# Patient Record
Sex: Female | Born: 1990 | Race: White | Hispanic: No | Marital: Single | Smoking: Never smoker
Health system: Southern US, Community
[De-identification: ages and names within clinical notes are randomized; demographics above are authoritative.]

---

## 2013-10-28 ENCOUNTER — Ambulatory Visit (INDEPENDENT_AMBULATORY_CARE_PROVIDER_SITE_OTHER): Payer: 59 | Admitting: Emergency Medicine

## 2013-10-28 ENCOUNTER — Other Ambulatory Visit: Payer: Self-pay | Admitting: Emergency Medicine

## 2013-10-28 ENCOUNTER — Ambulatory Visit (HOSPITAL_COMMUNITY)
Admission: RE | Admit: 2013-10-28 | Discharge: 2013-10-28 | Disposition: A | Discharge status: Home / Self Care | Payer: 59 | Source: Ambulatory Visit | Attending: Emergency Medicine | Admitting: Emergency Medicine

## 2013-10-28 VITALS — BP 124/78 | HR 98 | Temp 98.0°F | Resp 17 | Ht 65.5 in | Wt 195.0 lb

## 2013-10-28 DIAGNOSIS — R110 Nausea: Secondary | ICD-10-CM | POA: Insufficient documentation

## 2013-10-28 DIAGNOSIS — R1031 Right lower quadrant pain: Secondary | ICD-10-CM | POA: Insufficient documentation

## 2013-10-28 DIAGNOSIS — N949 Unspecified condition associated with female genital organs and menstrual cycle: Secondary | ICD-10-CM

## 2013-10-28 DIAGNOSIS — R102 Pelvic and perineal pain: Secondary | ICD-10-CM

## 2013-10-28 LAB — POCT CBC
GRANULOCYTE PERCENT: 69 % (ref 37–80)
HCT, POC: 43.9 % (ref 37.7–47.9)
Hemoglobin: 13.7 g/dL (ref 12.2–16.2)
Lymph, poc: 2.7 (ref 0.6–3.4)
MCH, POC: 29.1 pg (ref 27–31.2)
MCHC: 31.2 g/dL — AB (ref 31.8–35.4)
MCV: 93.1 fL (ref 80–97)
MID (CBC): 0.5 (ref 0–0.9)
MPV: 9.5 fL (ref 0–99.8)
PLATELET COUNT, POC: 326 10*3/uL (ref 142–424)
POC GRANULOCYTE: 7.1 — AB (ref 2–6.9)
POC LYMPH %: 26.2 % (ref 10–50)
POC MID %: 4.8 % (ref 0–12)
RBC: 4.71 M/uL (ref 4.04–5.48)
RDW, POC: 14 %
WBC: 10.3 10*3/uL — AB (ref 4.6–10.2)

## 2013-10-28 LAB — POCT WET PREP WITH KOH
CLUE CELLS WET PREP PER HPF POC: NEGATIVE
KOH Prep POC: NEGATIVE
RBC WET PREP PER HPF POC: NEGATIVE
TRICHOMONAS UA: NEGATIVE
Yeast Wet Prep HPF POC: NEGATIVE

## 2013-10-28 LAB — POCT URINALYSIS DIPSTICK
Blood, UA: NEGATIVE
Glucose, UA: NEGATIVE
Ketones, UA: NEGATIVE
Nitrite, UA: NEGATIVE
PH UA: 6
PROTEIN UA: 30
Spec Grav, UA: >=1.03
UROBILINOGEN UA: 0.2

## 2013-10-28 LAB — POCT UA - MICROSCOPIC ONLY
AMORPHOUS: POSITIVE
CASTS, UR, LPF, POC: NEGATIVE
Crystals, Ur, HPF, POC: NEGATIVE
Mucus, UA: NEGATIVE
Yeast, UA: NEGATIVE

## 2013-10-28 LAB — POCT URINE PREGNANCY: Preg Test, Ur: NEGATIVE

## 2013-10-28 NOTE — Patient Instructions (Addendum)
Go to [facility]  Outpatient Registration at the emergency department. REGISTER FOR OUTPATIENT U/S. NOT ED PATIENT      Pelvic Pain, Female Female pelvic pain can be caused by many different things and start from a variety of places. Pelvic pain refers to pain that is located in the lower half of the abdomen and between your hips. The pain may occur over a short period of time (acute) or may be reoccurring (chronic). The cause of pelvic pain may be related to disorders affecting the female reproductive organs (gynecologic), but it may also be related to the bladder, kidney stones, an intestinal complication, or muscle or skeletal problems. Getting help right away for pelvic pain is important, especially if there has been severe, sharp, or a sudden onset of unusual pain. It is also important to get help right away because some types of pelvic pain can be life threatening.  CAUSES  Below are only some of the causes of pelvic pain. The causes of pelvic pain can be in one of several categories.   Gynecologic.  Pelvic inflammatory disease.  Sexually transmitted infection.  Ovarian cyst or a twisted ovarian ligament (ovarian torsion).  Uterine lining that grows outside the uterus (endometriosis).  Fibroids, cysts, or tumors.  Ovulation.  Pregnancy.  Pregnancy that occurs outside the uterus (ectopic pregnancy).  Miscarriage.  Labor.  Abruption of the placenta or ruptured uterus.  Infection.  Uterine infection (endometritis).  Bladder infection.  Diverticulitis.  Miscarriage related to a uterine infection (septic abortion).  Bladder.  Inflammation of the bladder (cystitis).  Kidney stone(s).  Gastrointenstinal.  Constipation.  Diverticulitis.  Neurologic.  Trauma.  Feeling pelvic pain because of mental or emotional causes (psychosomatic).  Cancers of the bowel or pelvis. EVALUATION  Your caregiver will want to take a careful history of your concerns.  This includes recent changes in your health, a careful gynecologic history of your periods (menses), and a sexual history. Obtaining your family history and medical history is also important. Your caregiver may suggest a pelvic exam. A pelvic exam will help identify the location and severity of the pain. It also helps in the evaluation of which organ system may be involved. In order to identify the cause of the pelvic pain and be properly treated, your caregiver may order tests. These tests may include:   A pregnancy test.  Pelvic ultrasonography.  An X-ray exam of the abdomen.  A urinalysis or evaluation of vaginal discharge.  Blood tests. HOME CARE INSTRUCTIONS   Only take over-the-counter or prescription medicines for pain, discomfort, or fever as directed by your caregiver.   Rest as directed by your caregiver.   Eat a balanced diet.   Drink enough fluids to make your urine clear or pale yellow, or as directed.   Avoid sexual intercourse if it causes pain.   Apply warm or cold compresses to the lower abdomen depending on which one helps the pain.   Avoid stressful situations.   Keep a journal of your pelvic pain. Write down when it started, where the pain is located, and if there are things that seem to be associated with the pain, such as food or your menstrual cycle.  Follow up with your caregiver as directed.  SEEK MEDICAL CARE IF:  Your medicine does not help your pain.  You have abnormal vaginal discharge. SEEK IMMEDIATE MEDICAL CARE IF:   You have heavy bleeding from the vagina.   Your pelvic pain increases.   You feel lightheaded or  faint.   You have chills.   You have pain with urination or blood in your urine.   You have uncontrolled diarrhea or vomiting.   You have a fever or persistent symptoms for more than 3 days.  You have a fever and your symptoms suddenly get worse.   You are being physically or sexually abused.  MAKE SURE  YOU:  Understand these instructions.  Will watch your condition.  Will get help if you are not doing well or get worse. Document Released: 08/12/2004 Document Revised: 03/16/2012 Document Reviewed: 01/05/2012 [facility] Patient Information 2014 [place], [place].

## 2013-10-28 NOTE — Progress Notes (Addendum)
[facility] [address], [place] [address] [phone]  Date:  10/28/2013   Name:  Dana Scott   DOB:  Nov 09, 1990   MRN:  [id number]  PCP:  No primary provider on file.    Chief Complaint: Abdominal Pain, Nausea and Emesis   History of Present Illness:  Dana Scott is a 23 y.o. very pleasant female patient who presents with the following:  Sudden onset of RLQ pain at 0630.  Has nausea and vomited twice.  Had multiple episodes of diarrhea.  The patient has no complaint of blood, mucous, or pus in her stools.  No fever or chills.  No GU symptoms.  Poor appetite.  Traveled from [place] and became ill while traveling.  On OCP.  LMP 2 weeks.    There are no active problems to display for this patient.   No past medical history on file.  No past surgical history on file.  History  Substance Use Topics  . Smoking status: Never Smoker   . Smokeless tobacco: Not on file  . Alcohol Use: Not on file    No family history on file.  No Known Allergies  Medication list has been reviewed and updated.  No current outpatient prescriptions on file prior to visit.   No current facility-administered medications on file prior to visit.    Review of Systems:  As per HPI, otherwise negative.    Physical Examination: Filed Vitals:   10/28/13 1558  BP: 124/78  Pulse: 98  Temp: 98 F (36.7 C)  Resp: 17   Filed Vitals:   10/28/13 1558  Height: 5' 5.5" (1.664 m)  Weight: 195 lb (88.451 kg)   Body mass index is 31.94 kg/(m^2). Ideal Body Weight: Weight in (lb) to have BMI = 25: 152.2  GEN: WDWN, moderate distress, Non-toxic, A & O x 3 HEENT: Atraumatic, Normocephalic. Neck supple. No masses, No LAD. Ears and Nose: No external deformity. CV: RRR, No M/G/R. No JVD. No thrill. No extra heart sounds. PULM: CTA B, no wheezes, crackles, rhonchi. No retractions. No resp. distress. No accessory muscle use. ABD: S, tender in the RLQ with direct  rebound, ND, +BS. Marland Kitchen No HSM. EXTR: No c/c/e NEURO Normal gait.  PSYCH: Normally interactive. Conversant. Not depressed or anxious appearing.  Calm demeanor.  Physical Examination: Pelvic - normal external genitalia, vulva, vagina with moderate thin white discharge, cervix, uterus and right adnexa tenderness, exam chaperoned by Amy   Assessment and Plan: Pelvic pain [place]  Signed,  Phillips Odor, MD   Results for orders placed in visit on 10/28/13  POCT CBC      Result Value Range   WBC 10.3 (*) 4.6 - 10.2 K/uL   Lymph, poc 2.7  0.6 - 3.4   POC LYMPH PERCENT 26.2  10 - 50 %L   MID (cbc) 0.5  0 - 0.9   POC MID % 4.8  0 - 12 %M   POC Granulocyte 7.1 (*) 2 - 6.9   Granulocyte percent 69.0  37 - 80 %G   RBC 4.71  4.04 - 5.48 M/uL   Hemoglobin 13.7  12.2 - 16.2 g/dL   HCT, POC 29.5  62.1 - 47.9 %   MCV 93.1  80 - 97 fL   MCH, POC 29.1  27 - 31.2 pg   MCHC 31.2 (*) 31.8 - 35.4 g/dL   RDW, POC 30.8     Platelet Count, POC 326  142 - 424 K/uL  MPV 9.5  0 - 99.8 fL  POCT URINALYSIS DIPSTICK      Result Value Range   Color, UA yellow     Clarity, UA cloudy     Glucose, UA neg     Bilirubin, UA small     Ketones, UA neg     Spec Grav, UA >=1.030     Blood, UA neg     pH, UA 6.0     Protein, UA 30     Urobilinogen, UA 0.2     Nitrite, UA neg     Leukocytes, UA small (1+)    POCT UA - MICROSCOPIC ONLY      Result Value Range   WBC, Ur, HPF, POC 3-5     RBC, urine, microscopic 0-1     Bacteria, U Microscopic 2+     Mucus, UA neg     Epithelial cells, urine per micros 3-5     Crystals, Ur, HPF, POC neg     Casts, Ur, LPF, POC neg     Yeast, UA neg     Amorphous pos    POCT URINE PREGNANCY      Result Value Range   Preg Test, Ur Negative     Results for orders placed in visit on 10/28/13  GC/CHLAMYDIA PROBE AMP      Result Value Range   CT Probe RNA NEGATIVE     GC Probe RNA NEGATIVE    POCT CBC      Result Value Range   WBC 10.3 (*) 4.6 - 10.2 K/uL   Lymph, poc  2.7  0.6 - 3.4   POC LYMPH PERCENT 26.2  10 - 50 %L   MID (cbc) 0.5  0 - 0.9   POC MID % 4.8  0 - 12 %M   POC Granulocyte 7.1 (*) 2 - 6.9   Granulocyte percent 69.0  37 - 80 %G   RBC 4.71  4.04 - 5.48 M/uL   Hemoglobin 13.7  12.2 - 16.2 g/dL   HCT, POC 91.4  78.2 - 47.9 %   MCV 93.1  80 - 97 fL   MCH, POC 29.1  27 - 31.2 pg   MCHC 31.2 (*) 31.8 - 35.4 g/dL   RDW, POC 95.6     Platelet Count, POC 326  142 - 424 K/uL   MPV 9.5  0 - 99.8 fL  POCT URINALYSIS DIPSTICK      Result Value Range   Color, UA yellow     Clarity, UA cloudy     Glucose, UA neg     Bilirubin, UA small     Ketones, UA neg     Spec Grav, UA >=1.030     Blood, UA neg     pH, UA 6.0     Protein, UA 30     Urobilinogen, UA 0.2     Nitrite, UA neg     Leukocytes, UA small (1+)    POCT UA - MICROSCOPIC ONLY      Result Value Range   WBC, Ur, HPF, POC 3-5     RBC, urine, microscopic 0-1     Bacteria, U Microscopic 2+     Mucus, UA neg     Epithelial cells, urine per micros 3-5     Crystals, Ur, HPF, POC neg     Casts, Ur, LPF, POC neg     Yeast, UA neg     Amorphous pos    POCT  URINE PREGNANCY      Result Value Range   Preg Test, Ur Negative    POCT WET PREP WITH KOH      Result Value Range   Trichomonas, UA Negative     Clue Cells Wet Prep HPF POC neg     Epithelial Wet Prep HPF POC 5-7     Yeast Wet Prep HPF POC neg     Bacteria Wet Prep HPF POC trace     RBC Wet Prep HPF POC neg     WBC Wet Prep HPF POC 3-5     KOH Prep POC Negative

## 2013-10-29 ENCOUNTER — Telehealth: Payer: Self-pay

## 2013-10-29 LAB — GC/CHLAMYDIA PROBE AMP
CT Probe RNA: NEGATIVE
GC Probe RNA: NEGATIVE

## 2013-10-29 NOTE — Telephone Encounter (Signed)
Patient was sent to [facility] yesterday for an ultrasound.they were told the results were sent to Dr. Dareen PianoAnderson yesterday. I told them he is not here this weekend maybe someone clinical can help them. Patient and her boyfriend [place] (on patients HIPPA)  are very concerned. Please advise.   Best: [phone] [place] as secondary number: [phone]: [phone]

## 2013-10-29 NOTE — Telephone Encounter (Signed)
Can you review for pt.

## 2013-10-30 NOTE — Telephone Encounter (Signed)
The US indicated a prominent cervix and the patient indicated tenderness in the region of the cervix. The significance of the finding is uncertain as the cervix otherwise appears morphologically normal. Cervicitis is a consideration. Given this finding it would be best for the patient to RTC for further evaluation and treatment.

## 2013-10-31 NOTE — Telephone Encounter (Signed)
Per Dr. Dareen PianoAnderson- we are waiting on her lab results to come in. We will give her a call once those results are here and decide treatment from there.

## 2014-10-29 IMAGING — US US PELVIS COMPLETE
1 series · 13 of 25 positions shown · non-contrast
Comparison: None.

CLINICAL DATA: Right lower quadrant pain, pelvic pain and nausea

EXAM:
TRANSABDOMINAL AND TRANSVAGINAL ULTRASOUND OF PELVIS
DOPPLER ULTRASOUND OF OVARIES
TECHNIQUE: Both transabdominal and transvaginal ultrasound examinations of the
pelvis were performed. Transabdominal technique was performed for
global imaging of the pelvis including uterus, ovaries, adnexal
regions, and pelvic cul-de-sac.
It was necessary to proceed with endovaginal exam following the
transabdominal exam to visualize the uterus and ovaries. Color and
duplex Doppler ultrasound was utilized to evaluate blood flow to the
ovaries.

[Series 1: us pelvis complete · 0.24mm/px · 13 of 38 slices shown]
[im 1/38]
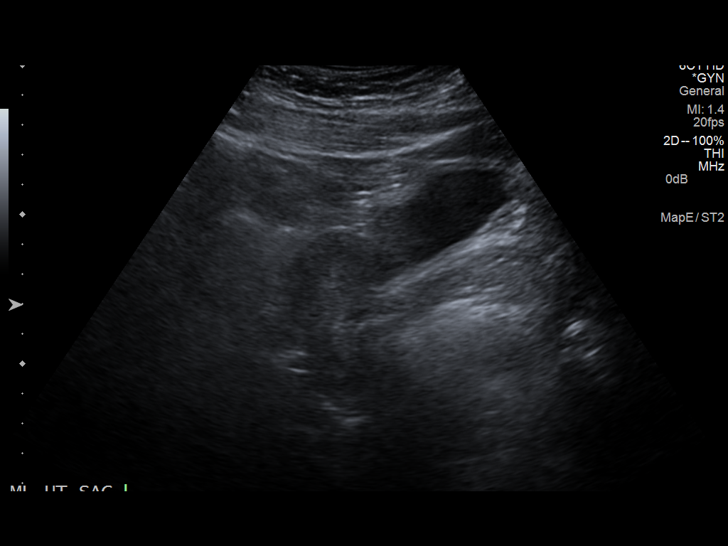
[im 4/38]
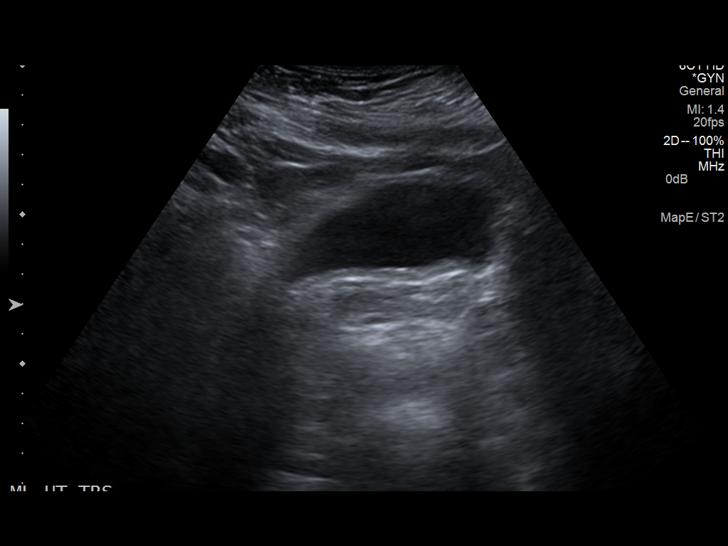
[im 7/38]
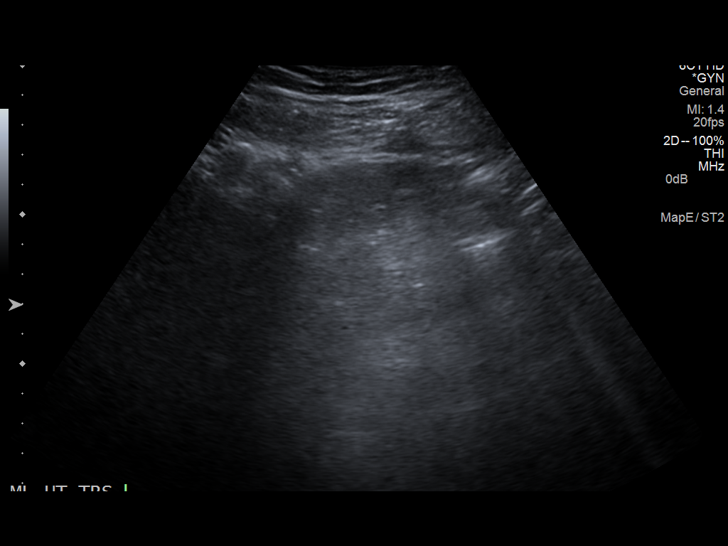
[im 10/38]
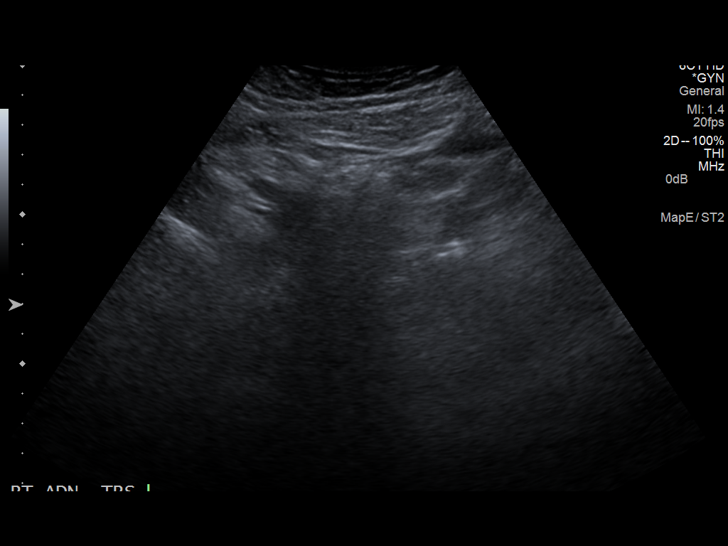
[im 13/38]
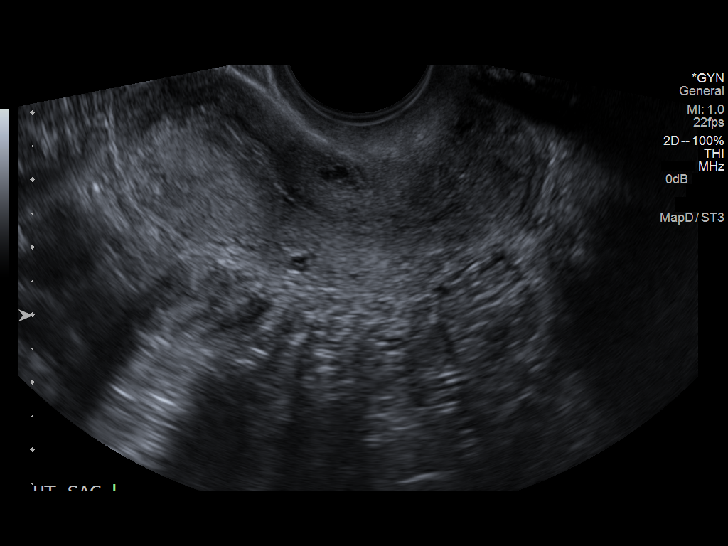
[im 16/38]
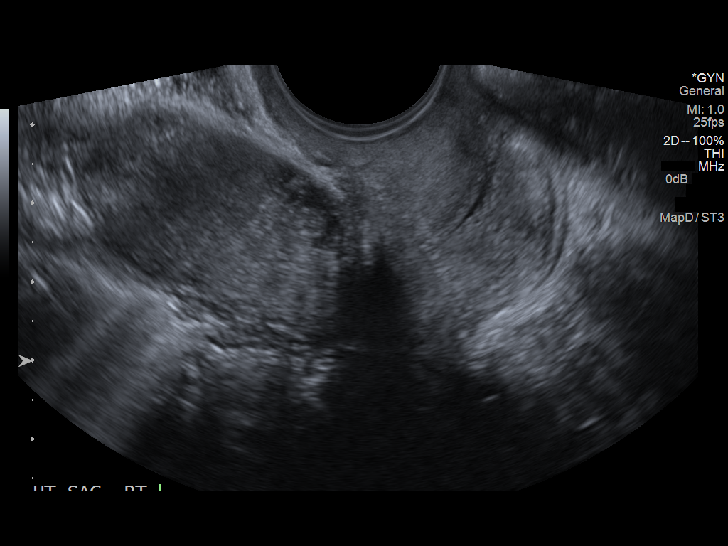
[im 19/38]
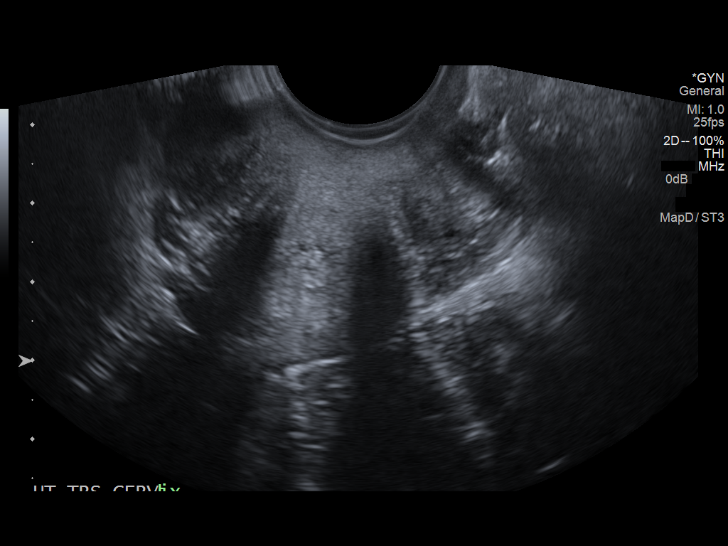
[im 22/38]
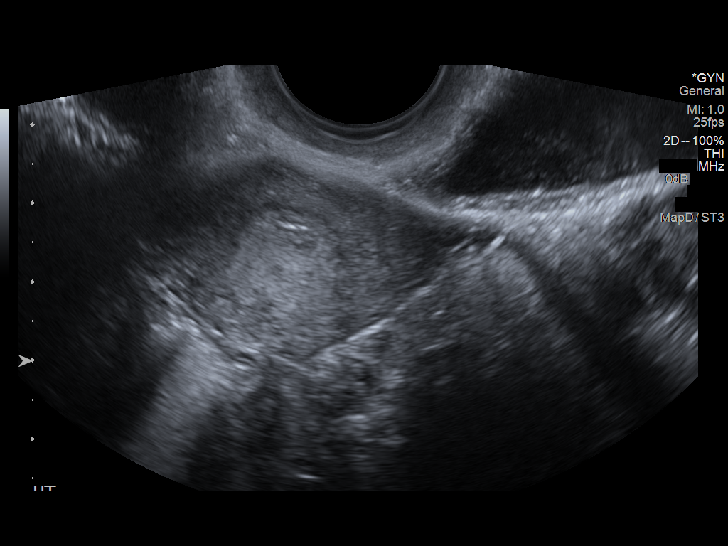
[im 25/38]
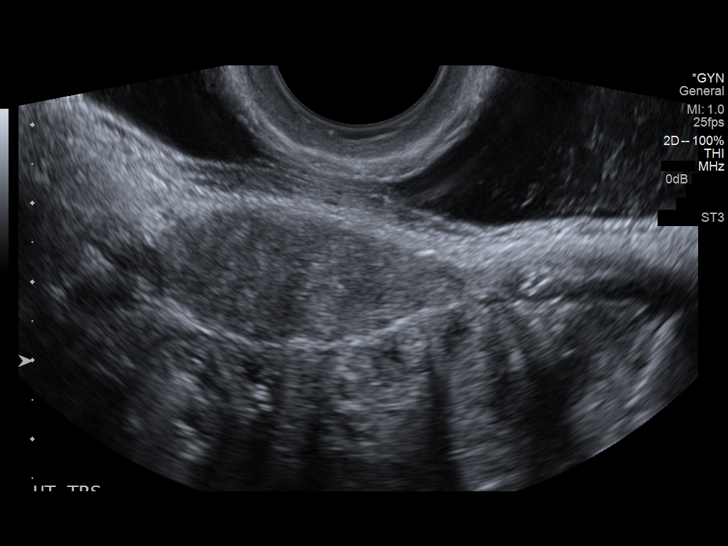
[im 28/38]
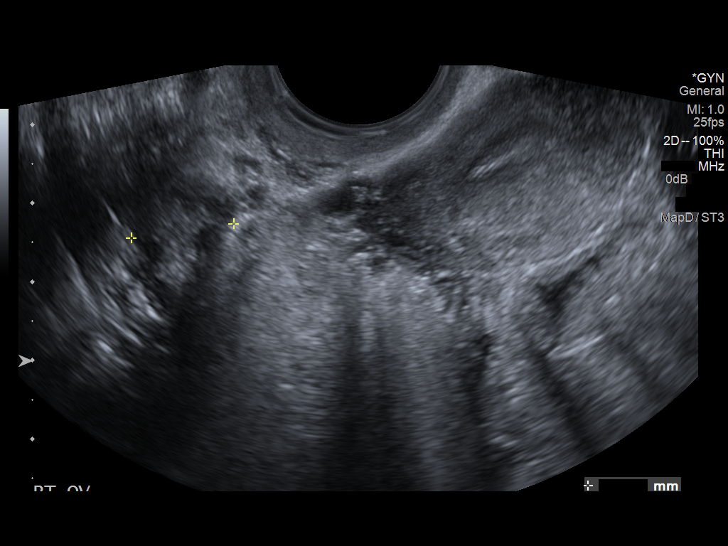
[im 31/38]
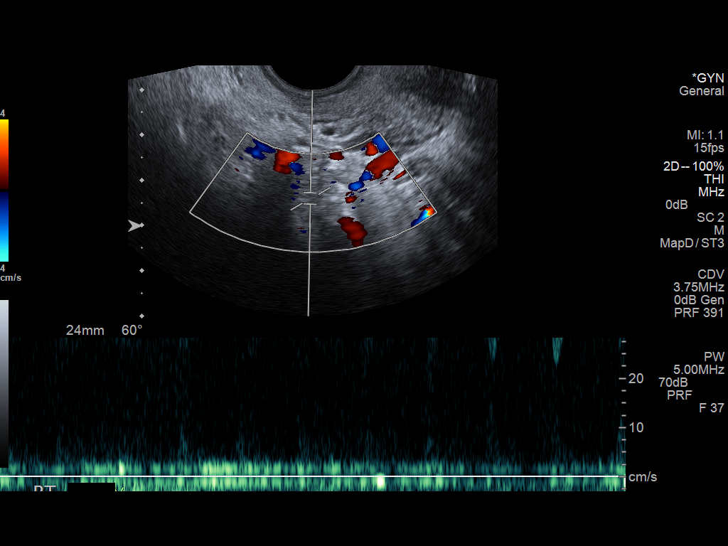
[im 34/38]
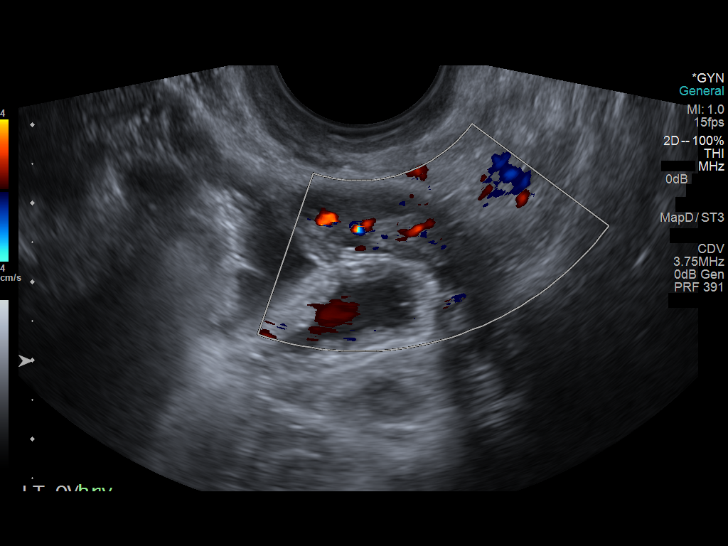
[im 38/38]
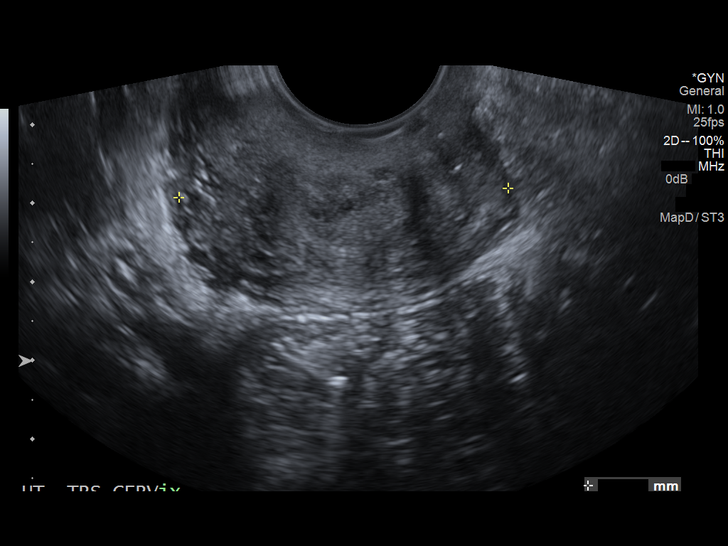

[13 of 25 positions shown; findings below may reference images not displayed]

FINDINGS: Uterus

Measurements: Uterus is 62 x 25 x 39 mm. The cervix is prominent at
4.2 cm in with, and it is tender upon scanning.. No fibroids or
other mass visualized.

Endometrium

Thickness: 4 mm.  No focal abnormality visualized.

Right ovary

Measurements: 28 x 15 x 13 mm. Normal appearance with normal blood
flow

Left ovary

Measurements: 29 x 13 x 17 mm.. Normal appearance with normal blood
flow

Pulsed Doppler evaluation of both ovaries demonstrates normal
low-resistance arterial and venous waveforms.

Other findings

No free fluid.
IMPRESSION: 1. The cervix appears prominent and the patient indicates tenderness
in the region of the cervix. The significance of this finding is
uncertain as the cervix appears otherwise morphologically normal.
Cervicitis is a consideration. Suggest correlation with gynecologic
examination.
